# Patient Record
Sex: Female | Born: 1985 | Race: Black or African American | Hispanic: No | Marital: Single | Smoking: Never smoker
Health system: Southern US, Community
[De-identification: ages and names within clinical notes are randomized; demographics above are authoritative.]

---

## 2006-08-30 ENCOUNTER — Emergency Department: Payer: Self-pay | Admitting: General Practice

## 2008-09-16 ENCOUNTER — Emergency Department (HOSPITAL_COMMUNITY): Admission: EM | Admit: 2008-09-16 | Discharge: 2008-09-16 | Payer: Self-pay | Admitting: Family Medicine

## 2010-02-20 IMAGING — CR DG CERVICAL SPINE COMPLETE 4+V
5 series · 5 of 5 positions shown · non-contrast
Comparison: None

CLINICAL DATA: patient thrown down stairs.  Right-sided neck pain.

CERVICAL SPINE - COMPLETE 4+ VIEW

[view not recorded (1 of 5)]
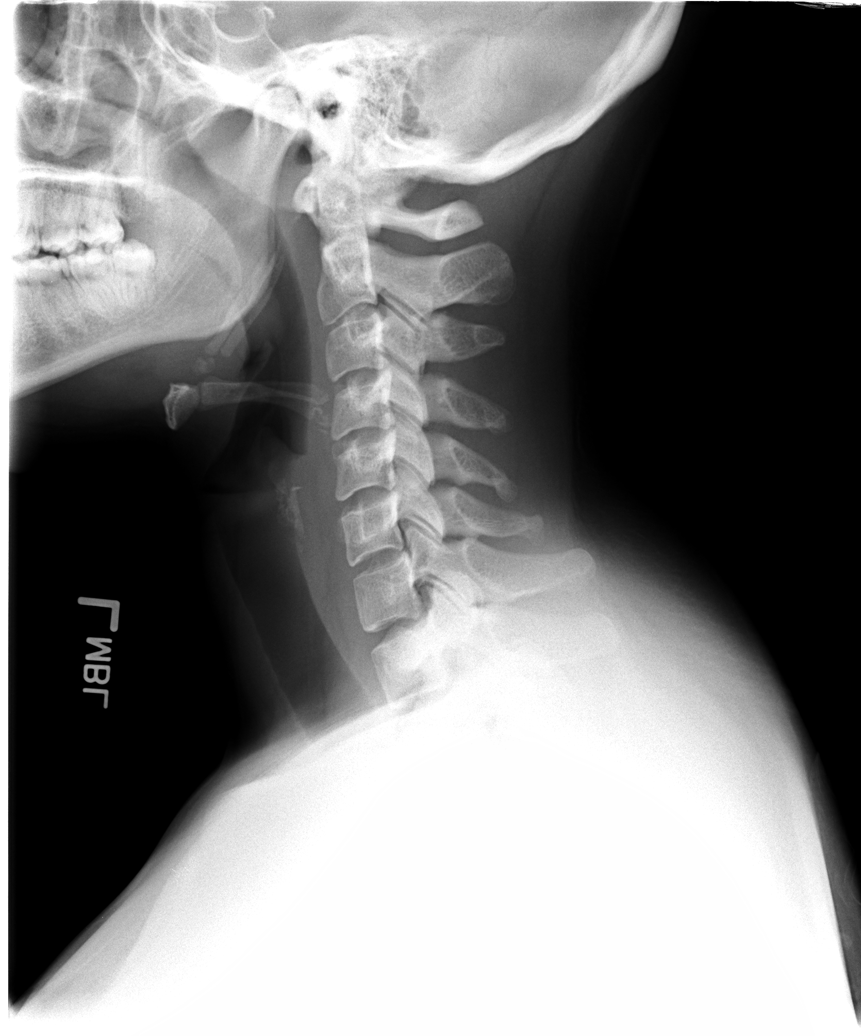

[view not recorded (2 of 5)]
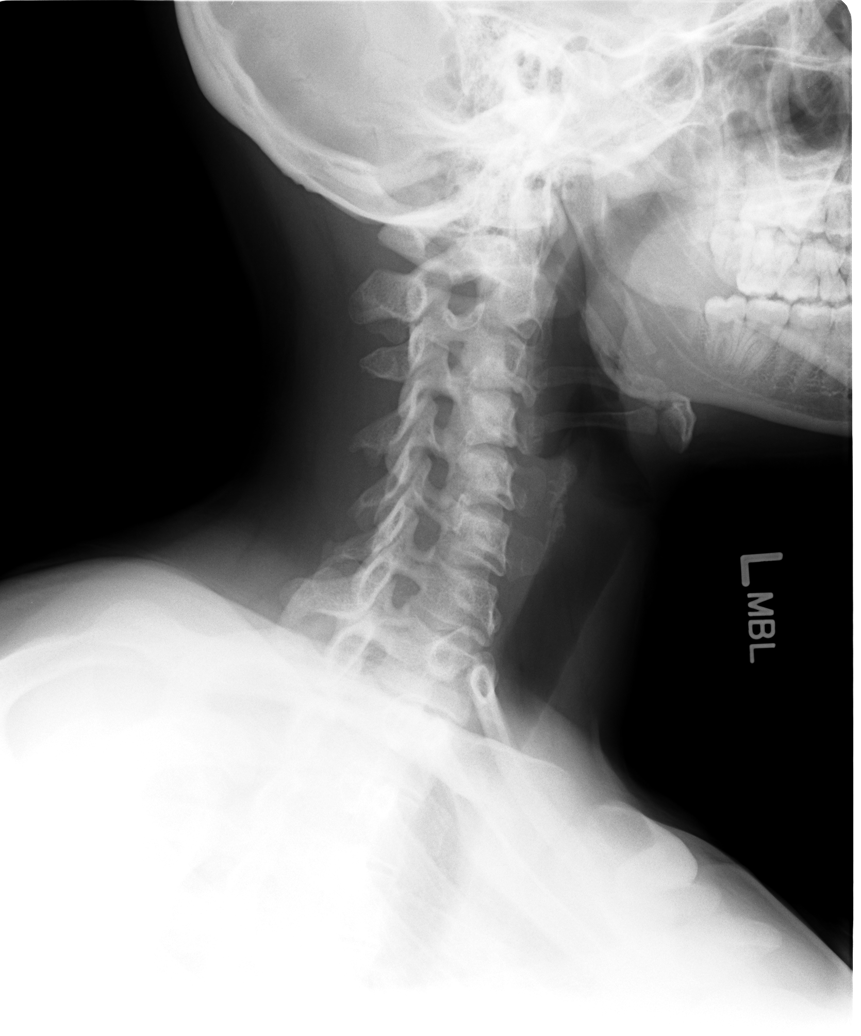

[view not recorded (3 of 5)]
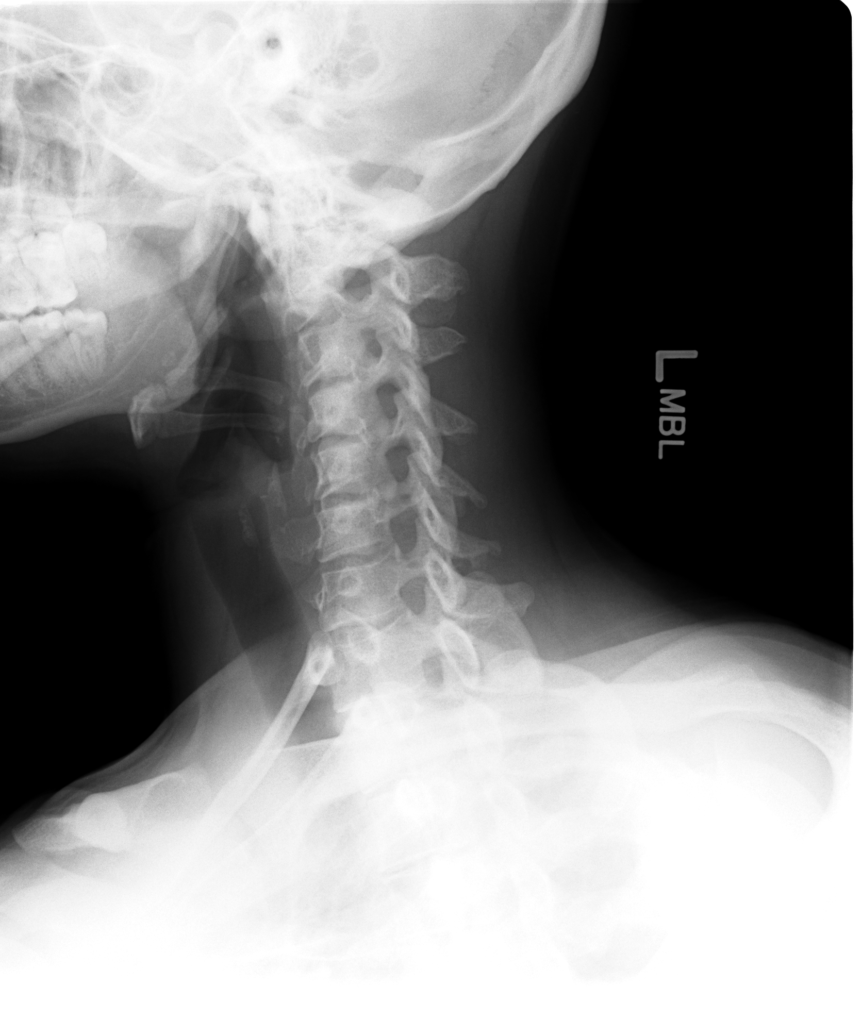

[view not recorded (4 of 5)]
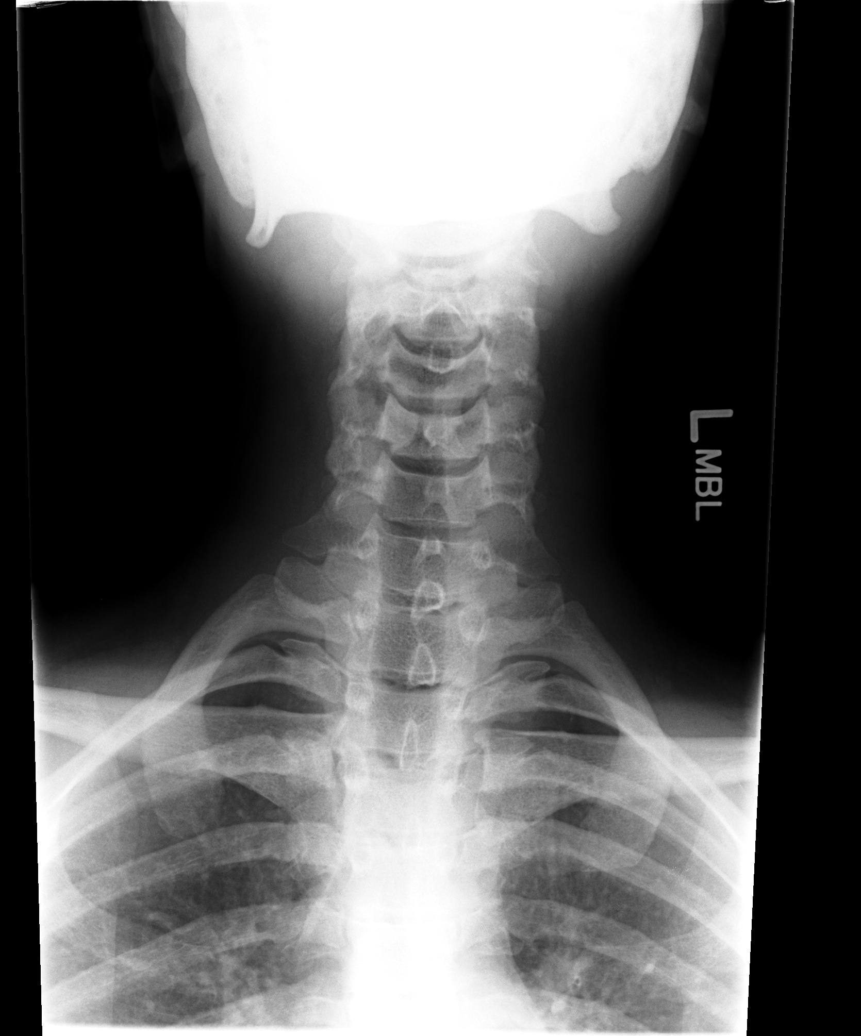

[view not recorded (5 of 5)]
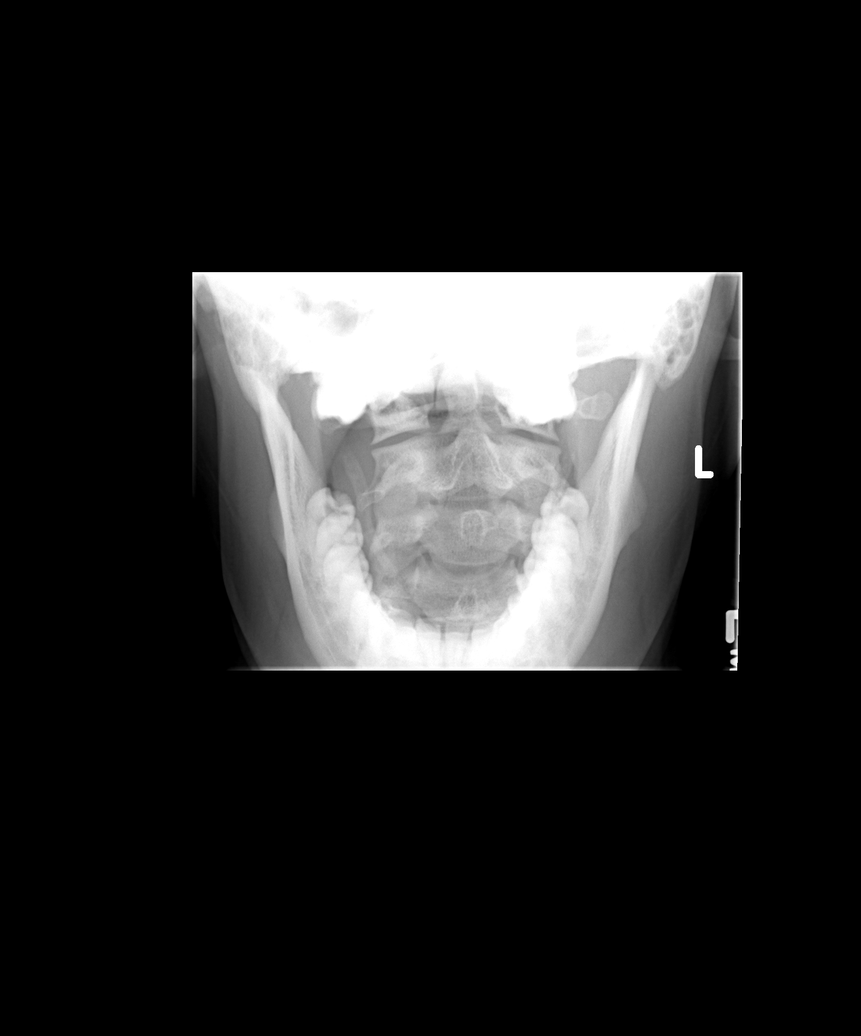

[5 of 5 positions shown; findings below may reference images not displayed]

FINDINGS: There is loss of normal cervical lordosis.  No evidence
of fracture or subluxation.  Prevertebral soft tissues are normal.
Cervicothoracic junction normal.
IMPRESSION: Cervical straightening.  No acute bony abnormality.

## 2010-10-25 LAB — POCT URINALYSIS DIP (DEVICE)
Ketones, ur: 15 mg/dL — AB
Protein, ur: 30 mg/dL — AB
Specific Gravity, Urine: 1.025 (ref 1.005–1.030)
pH: 6 (ref 5.0–8.0)

## 2010-10-25 LAB — POCT PREGNANCY, URINE: Preg Test, Ur: NEGATIVE

## 2012-12-13 HISTORY — PX: DILATION AND CURETTAGE OF UTERUS: SHX78

## 2012-12-17 ENCOUNTER — Ambulatory Visit: Payer: Self-pay | Admitting: Obstetrics and Gynecology

## 2012-12-17 LAB — URINALYSIS, COMPLETE
Bacteria: NONE SEEN
Bilirubin,UR: NEGATIVE
Ketone: NEGATIVE
Nitrite: NEGATIVE
Protein: NEGATIVE
RBC,UR: <1 /HPF (ref 0–5)
Specific Gravity: 1.014 (ref 1.003–1.030)
Squamous Epithelial: 10
WBC UR: 1 /HPF (ref 0–5)

## 2012-12-18 ENCOUNTER — Ambulatory Visit: Payer: Self-pay | Admitting: Obstetrics and Gynecology

## 2012-12-21 LAB — PATHOLOGY REPORT

## 2013-05-21 LAB — HM HIV SCREENING LAB: HM HIV Screening: NEGATIVE

## 2013-07-01 ENCOUNTER — Encounter: Payer: Self-pay | Admitting: Obstetrics and Gynecology

## 2013-08-06 ENCOUNTER — Inpatient Hospital Stay: Payer: Self-pay

## 2013-08-06 LAB — URINALYSIS, COMPLETE
BLOOD: NEGATIVE
Bacteria: NONE SEEN
Bilirubin,UR: NEGATIVE
Glucose,UR: NEGATIVE mg/dL (ref 0–75)
Leukocyte Esterase: NEGATIVE
Nitrite: NEGATIVE
PH: 7 (ref 4.5–8.0)
PROTEIN: NEGATIVE
SPECIFIC GRAVITY: 1.016 (ref 1.003–1.030)
WBC UR: 3 /HPF (ref 0–5)

## 2013-08-06 LAB — BASIC METABOLIC PANEL
Anion Gap: 5 — ABNORMAL LOW (ref 7–16)
BUN: 5 mg/dL — ABNORMAL LOW (ref 7–18)
CALCIUM: 8.6 mg/dL (ref 8.5–10.1)
CHLORIDE: 108 mmol/L — AB (ref 98–107)
CO2: 25 mmol/L (ref 21–32)
CREATININE: 0.58 mg/dL — AB (ref 0.60–1.30)
GLUCOSE: 76 mg/dL (ref 65–99)
Osmolality: 272 (ref 275–301)
Potassium: 3.5 mmol/L (ref 3.5–5.1)
SODIUM: 138 mmol/L (ref 136–145)

## 2013-08-06 LAB — CBC
HCT: 37.1 % (ref 35.0–47.0)
HGB: 12.5 g/dL (ref 12.0–16.0)
MCH: 31.1 pg (ref 26.0–34.0)
MCHC: 33.6 g/dL (ref 32.0–36.0)
MCV: 93 fL (ref 80–100)
PLATELETS: 149 10*3/uL — AB (ref 150–440)
RBC: 4.01 10*6/uL (ref 3.80–5.20)
RDW: 13.7 % (ref 11.5–14.5)
WBC: 7.6 10*3/uL (ref 3.6–11.0)

## 2013-08-06 LAB — WET PREP, GENITAL

## 2013-08-06 LAB — GC/CHLAMYDIA PROBE AMP

## 2013-08-06 LAB — HCG, QUANTITATIVE, PREGNANCY: BETA HCG, QUANT.: 32930 m[IU]/mL — AB

## 2013-08-07 LAB — CBC WITH DIFFERENTIAL/PLATELET
Basophil #: 0 10*3/uL (ref 0.0–0.1)
Basophil %: 0.5 %
EOS PCT: 1.4 %
Eosinophil #: 0.1 10*3/uL (ref 0.0–0.7)
HCT: 36.4 % (ref 35.0–47.0)
HGB: 12.1 g/dL (ref 12.0–16.0)
LYMPHS ABS: 1.7 10*3/uL (ref 1.0–3.6)
Lymphocyte %: 21.5 %
MCH: 30.8 pg (ref 26.0–34.0)
MCHC: 33.2 g/dL (ref 32.0–36.0)
MCV: 93 fL (ref 80–100)
MONO ABS: 0.7 x10 3/mm (ref 0.2–0.9)
MONOS PCT: 8.4 %
NEUTROS ABS: 5.4 10*3/uL (ref 1.4–6.5)
NEUTROS PCT: 68.2 %
Platelet: 145 10*3/uL — ABNORMAL LOW (ref 150–440)
RBC: 3.93 10*6/uL (ref 3.80–5.20)
RDW: 14 % (ref 11.5–14.5)
WBC: 8 10*3/uL (ref 3.6–11.0)

## 2013-08-08 LAB — CBC WITH DIFFERENTIAL/PLATELET
Basophil #: 0 10*3/uL (ref 0.0–0.1)
Basophil %: 0.3 %
EOS ABS: 0.2 10*3/uL (ref 0.0–0.7)
Eosinophil %: 2.3 %
HCT: 34.8 % — ABNORMAL LOW (ref 35.0–47.0)
HGB: 11.8 g/dL — ABNORMAL LOW (ref 12.0–16.0)
LYMPHS ABS: 2 10*3/uL (ref 1.0–3.6)
Lymphocyte %: 26 %
MCH: 31.5 pg (ref 26.0–34.0)
MCHC: 33.9 g/dL (ref 32.0–36.0)
MCV: 93 fL (ref 80–100)
MONOS PCT: 8.2 %
Monocyte #: 0.6 x10 3/mm (ref 0.2–0.9)
NEUTROS PCT: 63.2 %
Neutrophil #: 4.8 10*3/uL (ref 1.4–6.5)
Platelet: 153 10*3/uL (ref 150–440)
RBC: 3.74 10*6/uL — AB (ref 3.80–5.20)
RDW: 13.8 % (ref 11.5–14.5)
WBC: 7.6 10*3/uL (ref 3.6–11.0)

## 2013-08-09 LAB — CBC WITH DIFFERENTIAL/PLATELET
BASOS ABS: 0 10*3/uL (ref 0.0–0.1)
Basophil %: 0.3 %
Eosinophil #: 0.2 10*3/uL (ref 0.0–0.7)
Eosinophil %: 1.8 %
HCT: 35.3 % (ref 35.0–47.0)
HGB: 11.9 g/dL — AB (ref 12.0–16.0)
LYMPHS ABS: 1.9 10*3/uL (ref 1.0–3.6)
LYMPHS PCT: 21.8 %
MCH: 31.1 pg (ref 26.0–34.0)
MCHC: 33.6 g/dL (ref 32.0–36.0)
MCV: 93 fL (ref 80–100)
MONO ABS: 0.8 x10 3/mm (ref 0.2–0.9)
MONOS PCT: 8.7 %
NEUTROS PCT: 67.4 %
Neutrophil #: 5.9 10*3/uL (ref 1.4–6.5)
PLATELETS: 159 10*3/uL (ref 150–440)
RBC: 3.82 10*6/uL (ref 3.80–5.20)
RDW: 13.7 % (ref 11.5–14.5)
WBC: 8.7 10*3/uL (ref 3.6–11.0)

## 2013-08-09 LAB — URINE CULTURE

## 2013-08-10 LAB — CBC WITH DIFFERENTIAL/PLATELET
Basophil #: 0 10*3/uL (ref 0.0–0.1)
Basophil %: 0.4 %
EOS ABS: 0.2 10*3/uL (ref 0.0–0.7)
Eosinophil %: 2.4 %
HCT: 35.1 % (ref 35.0–47.0)
HGB: 11.7 g/dL — AB (ref 12.0–16.0)
LYMPHS PCT: 26.5 %
Lymphocyte #: 2.3 10*3/uL (ref 1.0–3.6)
MCH: 30.9 pg (ref 26.0–34.0)
MCHC: 33.4 g/dL (ref 32.0–36.0)
MCV: 93 fL (ref 80–100)
MONO ABS: 0.8 x10 3/mm (ref 0.2–0.9)
MONOS PCT: 8.8 %
NEUTROS PCT: 61.9 %
Neutrophil #: 5.3 10*3/uL (ref 1.4–6.5)
PLATELETS: 158 10*3/uL (ref 150–440)
RBC: 3.79 10*6/uL — ABNORMAL LOW (ref 3.80–5.20)
RDW: 13.7 % (ref 11.5–14.5)
WBC: 8.5 10*3/uL (ref 3.6–11.0)

## 2013-08-11 LAB — CBC WITH DIFFERENTIAL/PLATELET
BASOS PCT: 0.3 %
Basophil #: 0 10*3/uL (ref 0.0–0.1)
EOS PCT: 2.1 %
Eosinophil #: 0.2 10*3/uL (ref 0.0–0.7)
HCT: 35.4 % (ref 35.0–47.0)
HGB: 12 g/dL (ref 12.0–16.0)
LYMPHS ABS: 2.2 10*3/uL (ref 1.0–3.6)
Lymphocyte %: 26.1 %
MCH: 31.7 pg (ref 26.0–34.0)
MCHC: 34 g/dL (ref 32.0–36.0)
MCV: 93 fL (ref 80–100)
Monocyte #: 0.8 x10 3/mm (ref 0.2–0.9)
Monocyte %: 9.2 %
NEUTROS ABS: 5.2 10*3/uL (ref 1.4–6.5)
Neutrophil %: 62.3 %
Platelet: 158 10*3/uL (ref 150–440)
RBC: 3.79 10*6/uL — ABNORMAL LOW (ref 3.80–5.20)
RDW: 13.5 % (ref 11.5–14.5)
WBC: 8.4 10*3/uL (ref 3.6–11.0)

## 2013-08-12 ENCOUNTER — Inpatient Hospital Stay: Payer: Self-pay | Admitting: Obstetrics and Gynecology

## 2013-08-12 LAB — CBC WITH DIFFERENTIAL/PLATELET
BASOS ABS: 0 10*3/uL (ref 0.0–0.1)
BASOS PCT: 0.2 %
Eosinophil #: 0.1 10*3/uL (ref 0.0–0.7)
Eosinophil %: 1.4 %
HCT: 38 % (ref 35.0–47.0)
HGB: 12.8 g/dL (ref 12.0–16.0)
LYMPHS ABS: 1.5 10*3/uL (ref 1.0–3.6)
Lymphocyte %: 19.2 %
MCH: 31.2 pg (ref 26.0–34.0)
MCHC: 33.8 g/dL (ref 32.0–36.0)
MCV: 92 fL (ref 80–100)
MONOS PCT: 8.5 %
Monocyte #: 0.7 x10 3/mm (ref 0.2–0.9)
NEUTROS ABS: 5.7 10*3/uL (ref 1.4–6.5)
NEUTROS PCT: 70.7 %
Platelet: 174 10*3/uL (ref 150–440)
RBC: 4.12 10*6/uL (ref 3.80–5.20)
RDW: 13.8 % (ref 11.5–14.5)
WBC: 8 10*3/uL (ref 3.6–11.0)

## 2013-08-13 LAB — DRUG SCREEN, URINE
Amphetamines, Ur Screen: NEGATIVE (ref ?–1000)
Barbiturates, Ur Screen: NEGATIVE (ref ?–200)
Benzodiazepine, Ur Scrn: NEGATIVE (ref ?–200)
Cannabinoid 50 Ng, Ur ~~LOC~~: NEGATIVE (ref ?–50)
Cocaine Metabolite,Ur ~~LOC~~: NEGATIVE (ref ?–300)
MDMA (Ecstasy)Ur Screen: NEGATIVE (ref ?–500)
METHADONE, UR SCREEN: NEGATIVE (ref ?–300)
OPIATE, UR SCREEN: POSITIVE (ref ?–300)
PHENCYCLIDINE (PCP) UR S: NEGATIVE (ref ?–25)
Tricyclic, Ur Screen: NEGATIVE (ref ?–1000)

## 2013-08-13 LAB — HEMOGLOBIN: HGB: 11.2 g/dL — AB (ref 12.0–16.0)

## 2013-08-18 LAB — PATHOLOGY REPORT

## 2014-02-20 ENCOUNTER — Emergency Department: Payer: Self-pay | Admitting: Emergency Medicine

## 2014-02-20 LAB — CBC
HCT: 38.6 % (ref 35.0–47.0)
HGB: 12.9 g/dL (ref 12.0–16.0)
MCH: 30.8 pg (ref 26.0–34.0)
MCHC: 33.4 g/dL (ref 32.0–36.0)
MCV: 92 fL (ref 80–100)
Platelet: 162 10*3/uL (ref 150–440)
RBC: 4.2 10*6/uL (ref 3.80–5.20)
RDW: 15 % — AB (ref 11.5–14.5)
WBC: 7.2 10*3/uL (ref 3.6–11.0)

## 2014-02-20 LAB — HCG, QUANTITATIVE, PREGNANCY: Beta Hcg, Quant.: 118531 m[IU]/mL — ABNORMAL HIGH

## 2014-02-21 LAB — WET PREP, GENITAL

## 2014-02-21 LAB — GC/CHLAMYDIA PROBE AMP

## 2014-02-25 ENCOUNTER — Emergency Department (HOSPITAL_COMMUNITY)
Admission: EM | Admit: 2014-02-25 | Discharge: 2014-02-26 | Disposition: A | Discharge status: Home / Self Care | Payer: BC Managed Care – PPO | Attending: Emergency Medicine | Admitting: Emergency Medicine

## 2014-02-25 ENCOUNTER — Emergency Department (HOSPITAL_COMMUNITY): Payer: BC Managed Care – PPO

## 2014-02-25 ENCOUNTER — Encounter (HOSPITAL_COMMUNITY): Payer: Self-pay | Admitting: Emergency Medicine

## 2014-02-25 DIAGNOSIS — O200 Threatened abortion: Secondary | ICD-10-CM | POA: Insufficient documentation

## 2014-02-25 DIAGNOSIS — Z79899 Other long term (current) drug therapy: Secondary | ICD-10-CM | POA: Diagnosis not present

## 2014-02-25 DIAGNOSIS — N938 Other specified abnormal uterine and vaginal bleeding: Secondary | ICD-10-CM | POA: Insufficient documentation

## 2014-02-25 DIAGNOSIS — O368990 Maternal care for other specified fetal problems, unspecified trimester, not applicable or unspecified: Secondary | ICD-10-CM | POA: Insufficient documentation

## 2014-02-25 DIAGNOSIS — O418X10 Other specified disorders of amniotic fluid and membranes, first trimester, not applicable or unspecified: Secondary | ICD-10-CM

## 2014-02-25 DIAGNOSIS — O43899 Other placental disorders, unspecified trimester: Secondary | ICD-10-CM

## 2014-02-25 DIAGNOSIS — N949 Unspecified condition associated with female genital organs and menstrual cycle: Secondary | ICD-10-CM | POA: Insufficient documentation

## 2014-02-25 DIAGNOSIS — O468X1 Other antepartum hemorrhage, first trimester: Secondary | ICD-10-CM

## 2014-02-25 LAB — BASIC METABOLIC PANEL
Anion gap: 12 (ref 5–15)
BUN: 8 mg/dL (ref 6–23)
CHLORIDE: 98 meq/L (ref 96–112)
CO2: 24 mEq/L (ref 19–32)
Calcium: 9.7 mg/dL (ref 8.4–10.5)
Creatinine, Ser: 0.58 mg/dL (ref 0.50–1.10)
Glucose, Bld: 95 mg/dL (ref 70–99)
POTASSIUM: 4.3 meq/L (ref 3.7–5.3)
SODIUM: 134 meq/L — AB (ref 137–147)

## 2014-02-25 LAB — CBC WITH DIFFERENTIAL/PLATELET
BASOS PCT: 0 % (ref 0–1)
Basophils Absolute: 0 10*3/uL (ref 0.0–0.1)
EOS ABS: 0.1 10*3/uL (ref 0.0–0.7)
Eosinophils Relative: 1 % (ref 0–5)
HCT: 36.5 % (ref 36.0–46.0)
HEMOGLOBIN: 12.4 g/dL (ref 12.0–15.0)
LYMPHS ABS: 1.8 10*3/uL (ref 0.7–4.0)
Lymphocytes Relative: 24 % (ref 12–46)
MCH: 29.9 pg (ref 26.0–34.0)
MCHC: 34 g/dL (ref 30.0–36.0)
MCV: 88 fL (ref 78.0–100.0)
Monocytes Absolute: 0.5 10*3/uL (ref 0.1–1.0)
Monocytes Relative: 7 % (ref 3–12)
NEUTROS ABS: 5 10*3/uL (ref 1.7–7.7)
NEUTROS PCT: 68 % (ref 43–77)
PLATELETS: 178 10*3/uL (ref 150–400)
RBC: 4.15 MIL/uL (ref 3.87–5.11)
RDW: 14.4 % (ref 11.5–15.5)
WBC: 7.4 10*3/uL (ref 4.0–10.5)

## 2014-02-25 LAB — HCG, QUANTITATIVE, PREGNANCY: HCG, BETA CHAIN, QUANT, S: 127937 m[IU]/mL — AB (ref <5)

## 2014-02-25 NOTE — ED Notes (Addendum)
Pt. Came in POV with complaint of possible miscarriage , stated that she had large amount vaginal bleeding started at 0815 pm this [place]  [place] , also claimed of  Abdominal cramping at 6/10. Pt. Claimed of 8 weeks pregnancy. Pt. Had 2 miscarriage , latest was June of 2014.

## 2014-02-25 NOTE — ED Provider Notes (Signed)
CSN: [id number]     Arrival date & time 02/25/14  2046 History   First MD Initiated Contact with Patient 02/25/14 2139     Chief Complaint  Patient presents with  . Vaginal Bleeding  . Threatened Miscarriage    HPI Pt is [redacted] weeks pregnant, G3P0 (2 prior miscarriages).  History of recent vaginal bleeding.  Seen at [facility] last weekend.  Had an ultrasound and diagnosed with threatened ab.  Pt is A-.  Did receive rhogam last week.  Pt states she started having bleeding and abdominal cramping.   No past medical history on file. No past surgical history on file. History reviewed. No pertinent family history. History  Substance Use Topics  . Smoking status: Never Smoker   . Smokeless tobacco: Not on file  . Alcohol Use: No   OB History   Grav Para Term Preterm Abortions TAB SAB Ect Mult Living                 Review of Systems  All other systems reviewed and are negative.     Allergies  Review of patient's allergies indicates no known allergies.  Home Medications   Prior to Admission medications   Medication Sig Start Date End Date Taking? Authorizing Provider  Prenatal Vit-Fe Fumarate-FA (PRENATAL MULTIVITAMIN) TABS tablet Take 1 tablet by mouth daily at 12 noon.   Yes Historical Provider, MD   Pulse 73  Temp(Src) 98.3 F (36.8 C) (Oral)  Resp 16  Ht 5\' 5"  (1.651 m)  Wt 201 lb (91.173 kg)  BMI 33.45 kg/m2  SpO2 100% Physical Exam  Nursing note and vitals reviewed. Constitutional: She appears well-developed and well-nourished. No distress.  HENT:  Head: Normocephalic and atraumatic.  Right Ear: External ear normal.  Left Ear: External ear normal.  Eyes: Conjunctivae are normal. Right eye exhibits no discharge. Left eye exhibits no discharge. No scleral icterus.  Neck: Neck supple. No tracheal deviation present.  Cardiovascular: Normal rate, regular rhythm and intact distal pulses.   Pulmonary/Chest: Effort normal and breath sounds normal. No stridor. No  respiratory distress. She has no wheezes. She has no rales.  Abdominal: Soft. Bowel sounds are normal. She exhibits no distension. There is no tenderness. There is no rebound and no guarding.  Genitourinary: Uterus is enlarged. Uterus is not tender. Cervix exhibits no motion tenderness and no friability. Right adnexum displays no mass. Left adnexum displays no mass and no tenderness. There is bleeding around the vagina. No signs of injury around the vagina.  Blood in vaginal vault  Musculoskeletal: She exhibits no edema and no tenderness.  Neurological: She is alert. She has normal strength. No cranial nerve deficit (no facial droop, extraocular movements intact, no slurred speech) or sensory deficit. She exhibits normal muscle tone. She displays no seizure activity. Coordination normal.  Skin: Skin is warm and dry. No rash noted.  Psychiatric: She has a normal mood and affect.    ED Course  Procedures (including critical care time) Labs Review Labs Reviewed  BASIC METABOLIC PANEL - Abnormal; Notable for the following:    Sodium 134 (*)    All other components within normal limits  HCG, QUANTITATIVE, PREGNANCY - Abnormal; Notable for the following:    hCG, Beta Francene Finders [id number] (*)    All other components within normal limits  CBC WITH DIFFERENTIAL    Imaging Review US Ob Comp Less 14 Wks  02/26/2014   CLINICAL DATA:  Vaginal bleeding.  EXAM: OBSTETRIC <14  WK US AND TRANSVAGINAL OB US  TECHNIQUE: Both transabdominal and transvaginal ultrasound examinations were performed for complete evaluation of the gestation as well as the maternal uterus, adnexal regions, and pelvic cul-de-sac. Transvaginal technique was performed to assess early pregnancy.  COMPARISON:  None.  FINDINGS: Intrauterine gestational sac: Visualized/normal in shape.  Yolk sac:  Visualized  Embryo:  Visualized  Cardiac Activity: Visualized  Heart Rate:  173 bpm  MSD:    mm    w     d  CRL:   21  mm   8 w 5 d                   US EDC: 10/02/2014  Maternal uterus/adnexae: Small subchorionic hemorrhage. Ovaries symmetric in size and echotexture. No adnexal masses. No free fluid.  IMPRESSION: Eight week 5 day intrauterine pregnancy. Fetal heart rate 173 beats per min. Small subchorionic hemorrhage.   Electronically Signed   By: Charlett NoseKevin  Dover M.D.   On: 02/26/2014 00:01   [place] Ob Transvaginal  02/26/2014   CLINICAL DATA:  Vaginal bleeding.  EXAM: OBSTETRIC <14 WK US AND TRANSVAGINAL OB US  TECHNIQUE: Both transabdominal and transvaginal ultrasound examinations were performed for complete evaluation of the gestation as well as the maternal uterus, adnexal regions, and pelvic cul-de-sac. Transvaginal technique was performed to assess early pregnancy.  COMPARISON:  None.  FINDINGS: Intrauterine gestational sac: Visualized/normal in shape.  Yolk sac:  Visualized  Embryo:  Visualized  Cardiac Activity: Visualized  Heart Rate:  173 bpm  MSD:    mm    w     d  CRL:   21  mm   8 w 5 d                  US EDC: 10/02/2014  Maternal uterus/adnexae: Small subchorionic hemorrhage. Ovaries symmetric in size and echotexture. No adnexal masses. No free fluid.  IMPRESSION: Eight week 5 day intrauterine pregnancy. Fetal heart rate 173 beats per min. Small subchorionic hemorrhage.   Electronically Signed   By: Charlett NoseKevin  Dover M.D.   On: 02/26/2014 00:01      MDM   Final diagnoses:  Subchorionic hemorrhage, first trimester  Threatened miscarriage in early pregnancy    Pt is having persistent threatened miscarriage.  US continues to show viable fetus.  Pt is A- but did receive rhogham within this week.  No need for additional dose.  Pt stable for discharge.  Follow up with her OB GYN    Linwood DibblesJon Dietra Stokely, MD 02/26/14 [phone]

## 2014-02-26 NOTE — Discharge Instructions (Signed)

## 2014-11-04 NOTE — Op Note (Signed)
PATIENT NAME:  Shirley Weber, Shirley Weber MR#:  [id number] DATE OF BIRTH:  22-Nov-1985  DATE OF PROCEDURE:  12/18/2012  PREOPERATIVE DIAGNOSIS: Incomplete abortion.   POSTOPERATIVE DIAGNOSIS:  Incomplete abortion.  PROCEDURE:  Suction dilation and curettage.  SURGEON: Ricky Weber. Logan BoresEvans, MD  ANESTHESIA: LMA.   FINDINGS: Easily dilatable cervix, return of products consistent with products of conception. Good uterine cry at the end of the case.   ESTIMATED BLOOD LOSS: Minimal.   COMPLICATIONS: None.   DRAINS: In and out catheter with a red rubber catheter at the end of the case with approximately 100 mL of urine.   SPECIMEN: Products of conception.   COMPLICATIONS: None.   ANTIBIOTICS:  A gram of Ancef given IV preoperatively.   PROCEDURE IN DETAIL: The patient was consented. Preoperative antibiotics were given. She was taken to the operating room and placed in the supine position where anesthesia was initiated. She was then placed in the dorsal lithotomy position using candy-cane stirrups, prepped and draped in the usual sterile fashion. The cervix was visualized and grasped with a single-tooth tenaculum, easily dilated to permit passage of a #8 rigid curved suction curette, which was passed with immediate return of products of conception. This was done approximately 3 more times then gentle sharp curettage was performed showing good uterine cry throughout including the cornual region.   Suction curette was passed 1 additional time and there was minimal return of bubbly blood and notable firming of the uterus was seen.   At this point, the procedure was felt to have achieved maximum efficacy. Instruments were removed. The cervix was made hemostatic with silver nitrate and the bladder was drained.   The patient tolerated the procedure well. She was returned to the supine position and left in the care of anesthesia.   I anticipate a routine postoperative course.  ____________________________ Reatha Harpsicky  Weber. Logan BoresEvans, MD rle:sb D: 12/18/2012 10:34:48 ET T: 12/18/2012 10:41:37 ET JOB#: [id number]  cc: Ricky Weber. Logan BoresEvans, MD, <Dictator> Augustina MoodICK Weber Stella Encarnacion MD ELECTRONICALLY SIGNED 12/19/2012 11:09

## 2014-11-22 NOTE — H&P (Signed)
L&D Evaluation:  History:  HPI 29 y/o G2P0010 @ 17/[red[facility] ED this am with reports of leaking clear fluid ("gush down my legs") after voiding early this am. Denies vaginal bleeding,cramping, abdominal tenderness, backache or UTI sx. No fetal movement NL @ 17wks). Care @ [facility]. US per ER MD FHT +133 no amniotic fluid visualized, minimal fetal movement. [facility] NL scan 07/01/13. A negative blood type.   Presents with leaking fluid   Patient's Medical History No Chronic Illness  HX GC CMZ Trich with PID prior to this pregnancy. Negative  GC CMZ  05/28/13   Patient's Surgical History Incomplete SAB 1st trimester with DC 12/2012   Medications Pre Natal Vitamins   Allergies NKDA   Social History none   Family History Non-Contributory   ROS:  ROS All systems were reviewed.  HEENT, CNS, GI, GU, Respiratory, CV, Renal and Musculoskeletal systems were found to be normal.   Exam:  Vital Signs stable  AF   Urine Protein to lab   General no apparent distress   Mental Status clear   Chest clear   Heart normal sinus rhythm   Abdomen gravid, non-tender   Estimated Fetal Weight Average for gestational age   Fetal Position breech   Fundal Height U-2   Back no CVAT   Edema no edema   Reflexes 1+   Clonus negative   Pelvic no external lesions, Sterile speculum exam (cx appeared closed-no vaginal bleeding) no digital exam   Mebranes Ruptured, clear gush @ 0530 - 08/06/13   Description clear   FHT + US 130's   Ucx absent   Skin dry   Lymph no lymphadenopathy   Impression:  Impression PPROM, PPROM @ 17/4wks   Plan:  Plan see below   Comments Discussed Ultrasound +FHT, no amniotic fluid visualized, + nitrazine + fern at 17/4wks. Pt and family advised baby not viable until 23weeks (severely preterm). Support offered, multiple questions answered. Will observe on LD, begin antibiotics and plan to transfer to [facility] for next few  days. Will repeat US 08/08/13. Explained if maternal fever/lab changes, cramping, bleeding begin, will proceed to delivery. Awaiting UA CS, GC CMZ culture per ER. Wet prep negative. Dr Feliberto GottronSchermerhorn consulted, orders received.   Electronic Signatures: Shirley Weber, Shirley Weber (CNM)  (Signed 23-Jan-15 12:23)  Authored: L&D Evaluation   Last Updated: 23-Jan-15 12:23 by Shirley Weber, Kaprice Kage Weber (CNM)

## 2019-02-12 ENCOUNTER — Ambulatory Visit (LOCAL_COMMUNITY_HEALTH_CENTER): Payer: Self-pay

## 2019-02-12 ENCOUNTER — Other Ambulatory Visit: Payer: Self-pay

## 2019-02-12 DIAGNOSIS — Z111 Encounter for screening for respiratory tuberculosis: Secondary | ICD-10-CM

## 2019-02-15 ENCOUNTER — Other Ambulatory Visit: Payer: Self-pay

## 2019-02-15 ENCOUNTER — Ambulatory Visit (LOCAL_COMMUNITY_HEALTH_CENTER): Payer: Medicaid Other

## 2019-02-15 DIAGNOSIS — Z111 Encounter for screening for respiratory tuberculosis: Secondary | ICD-10-CM

## 2019-02-15 LAB — TB SKIN TEST
Induration: 0 mm
TB Skin Test: NEGATIVE

## 2019-10-25 ENCOUNTER — Other Ambulatory Visit: Payer: Self-pay

## 2019-10-25 ENCOUNTER — Ambulatory Visit
Admission: EM | Admit: 2019-10-25 | Discharge: 2019-10-25 | Disposition: A | Discharge status: Home / Self Care | Payer: Medicaid Other | Attending: Emergency Medicine | Admitting: Emergency Medicine

## 2019-10-25 DIAGNOSIS — Z202 Contact with and (suspected) exposure to infections with a predominantly sexual mode of transmission: Secondary | ICD-10-CM | POA: Diagnosis present

## 2019-10-25 DIAGNOSIS — N898 Other specified noninflammatory disorders of vagina: Secondary | ICD-10-CM | POA: Insufficient documentation

## 2019-10-25 LAB — POCT URINALYSIS DIP (MANUAL ENTRY)
Bilirubin, UA: NEGATIVE
Glucose, UA: NEGATIVE mg/dL
Ketones, POC UA: NEGATIVE mg/dL
Nitrite, UA: NEGATIVE
Protein Ur, POC: NEGATIVE mg/dL
Spec Grav, UA: 1.015 (ref 1.010–1.025)
Urobilinogen, UA: 0.2 E.U./dL
pH, UA: 6.5 (ref 5.0–8.0)

## 2019-10-25 MED ORDER — CEFTRIAXONE SODIUM 500 MG IJ SOLR
500.0000 mg | Freq: Once | INTRAMUSCULAR | Status: AC
  Administered 2019-10-25: 09:00:00 500 mg via INTRAMUSCULAR

## 2019-10-25 MED ORDER — DOXYCYCLINE HYCLATE 100 MG PO CAPS: 100.0000 mg | ORAL_CAPSULE | Freq: Two times a day (BID) | ORAL | 0 refills | Status: AC

## 2019-10-25 MED ORDER — METRONIDAZOLE 500 MG PO TABS: 500.0000 mg | ORAL_TABLET | Freq: Two times a day (BID) | ORAL | 0 refills | Status: AC

## 2019-10-25 NOTE — ED Provider Notes (Signed)
Renaldo Fiddler    CSN: [id number] Arrival date & time: 10/25/19  0847      History   Chief Complaint Chief Complaint  Patient presents with  . Urinary Tract Infection    HPI Shirley Weber is a 34 y.o. female. Patient presents with 3 day history of white-green vaginal discharge and vaginal irritation.  The irritation is worse when she urinates.  She states she recently had sex without using a condom and would like to be checked for STDs.  She denies fever, chills, rash, lesions, abdominal pain, dysuria, back pain, pelvic pain, or other symptoms.  No treatment attempted at home.     The history is provided by the patient.    History reviewed. No pertinent past medical history.  There are no problems to display for this patient.   Past Surgical History:  Procedure Laterality Date  . DILATION AND CURETTAGE OF UTERUS  12/2012   blighted ovum    OB History   No obstetric history on file.      Home Medications    Prior to Admission medications   Medication Sig Start Date End Date Taking? Authorizing Provider  acetaminophen (TYLENOL) 325 MG tablet Take by mouth. 09/28/17  Yes [provider]  Docusate Sodium (DSS) 100 MG CAPS Take by mouth. 09/28/17  Yes [provider]  ibuprofen (ADVIL) 600 MG tablet Take by mouth. 09/28/17  Yes [provider]  doxycycline (VIBRAMYCIN) 100 MG capsule Take 1 capsule (100 mg total) by mouth 2 (two) times daily for 7 days. 10/25/19 11/01/19  Mickie Bail, NP  metroNIDAZOLE (FLAGYL) 500 MG tablet Take 1 tablet (500 mg total) by mouth 2 (two) times daily. 10/25/19   Mickie Bail, NP  Prenatal 28-0.8 MG TABS Take by mouth.    [provider]  Prenatal Vit-Fe Fumarate-FA (PNV PRENATAL PLUS MULTIVITAMIN) 27-1 MG TABS Take by mouth.    [provider]  Prenatal Vit-Fe Fumarate-FA (PRENATAL MULTIVITAMIN) TABS tablet Take 1 tablet by mouth daily at 12 noon.    [provider]  zolpidem  (AMBIEN) 5 MG tablet Take by mouth.    [provider]    Family History Family History  Problem Relation Age of Onset  . Clotting disorder Mother        blood clots  . Arrhythmia Father   . Heart disease Maternal Grandfather     Social History Social History   Tobacco Use  . Smoking status: Never Smoker  . Smokeless tobacco: Never Used  Substance Use Topics  . Alcohol use: Yes  . Drug use: No     Allergies   Latex   Review of Systems Review of Systems  Constitutional: Negative for chills and fever.  HENT: Negative for ear pain and sore throat.   Eyes: Negative for pain and visual disturbance.  Respiratory: Negative for cough and shortness of breath.   Cardiovascular: Negative for chest pain and palpitations.  Gastrointestinal: Negative for abdominal pain and vomiting.  Genitourinary: Positive for vaginal discharge. Negative for dysuria, flank pain, hematuria and pelvic pain.  Musculoskeletal: Negative for arthralgias and back pain.  Skin: Negative for color change and rash.  Neurological: Negative for seizures and syncope.  All other systems reviewed and are negative.    Physical Exam Triage Vital Signs ED Triage Vitals  Enc Vitals Group     BP      Pulse      Resp      Temp  Temp src      SpO2      Weight      Height      Head Circumference      Peak Flow      Pain Score      Pain Loc      Pain Edu?      Excl. in GC?    No data found.  Updated Vital Signs BP 132/83 (BP Location: Left Arm)   Pulse 60   Temp 98.1 F (36.7 C) (Oral)   Resp 16   Wt 185 lb (83.9 kg)   SpO2 97%   BMI 29.63 kg/m   Visual Acuity Right Eye Distance:   Left Eye Distance:   Bilateral Distance:    Right Eye Near:   Left Eye Near:    Bilateral Near:     Physical Exam Vitals and nursing note reviewed.  Constitutional:      General: She is not in acute distress.    Appearance: She is well-developed.  HENT:     Head: Normocephalic and  atraumatic.     Mouth/Throat:     Mouth: Mucous membranes are moist.     Pharynx: Oropharynx is clear.  Eyes:     Conjunctiva/sclera: Conjunctivae normal.  Cardiovascular:     Rate and Rhythm: Normal rate and regular rhythm.     Heart sounds: No murmur.  Pulmonary:     Effort: Pulmonary effort is normal. No respiratory distress.     Breath sounds: Normal breath sounds.  Abdominal:     General: Bowel sounds are normal.     Palpations: Abdomen is soft.     Tenderness: There is no abdominal tenderness. There is no right CVA tenderness, left CVA tenderness, guarding or rebound.  Musculoskeletal:     Cervical back: Neck supple.  Skin:    General: Skin is warm and dry.     Findings: No rash.  Neurological:     General: No focal deficit present.     Mental Status: She is alert and oriented to person, place, and time.  Psychiatric:        Mood and Affect: Mood normal.        Behavior: Behavior normal.      UC Treatments / Results  Labs (all labs ordered are listed, but only abnormal results are displayed) Labs Reviewed  POCT URINALYSIS DIP (MANUAL ENTRY) - Abnormal; Notable for the following components:      Result Value   Blood, UA trace-intact (*)    Leukocytes, UA Trace (*)    All other components within normal limits  URINE CULTURE  CERVICOVAGINAL ANCILLARY ONLY    EKG   Radiology No results found.  Procedures Procedures (including critical care time)  Medications Ordered in UC Medications  cefTRIAXone (ROCEPHIN) injection 500 mg (500 mg Intramuscular Given 10/25/19 0913)    Initial Impression / Assessment and Plan / UC Course  I have reviewed the triage vital signs and the nursing notes.  Pertinent labs & imaging results that were available during my care of the patient were reviewed by me and considered in my medical decision making (see chart for details).   Vaginal discharge, possible exposure to STD.  Urine culture pending.  Patient obtained vaginal self  swab.  Treating with Rocephin, doxycycline, metronidazole.  Instructed patient to abstain from sexual activity for 7 days.  Discussed that we will call her if her STD tests are positive and that her partner may require treatment  at that time.  Patient agrees to plan of care.   Final Clinical Impressions(s) / UC Diagnoses   Final diagnoses:  Possible exposure to STD  Vaginal discharge     Discharge Instructions     You were treated with an antibiotic today called Rocephin.  Additionally, you are prescribed metronidazole twice a day for 7 days and doxycycline twice a day for 7 days.    Do not have sex for 7 days.  Your STD tests are pending.  If your test results are positive, we will call you.  You may need additional treatment and your partner(s) may also need treatment.           ED Prescriptions    Medication Sig Dispense Auth. Provider   doxycycline (VIBRAMYCIN) 100 MG capsule Take 1 capsule (100 mg total) by mouth 2 (two) times daily for 7 days. 14 capsule Sharion Balloon, NP   metroNIDAZOLE (FLAGYL) 500 MG tablet Take 1 tablet (500 mg total) by mouth 2 (two) times daily. 14 tablet Sharion Balloon, NP     PDMP not reviewed this encounter.   Sharion Balloon, NP 10/25/19 [phone]

## 2019-10-25 NOTE — ED Triage Notes (Signed)
Pt is here with vaginal discharge and itching when urinating this started 3 days ago. Pt has not taken anything to relieve discomfort.

## 2019-10-25 NOTE — Discharge Instructions (Addendum)
You were treated with an antibiotic today called Rocephin.  Additionally, you are prescribed metronidazole twice a day for 7 days and doxycycline twice a day for 7 days.    Do not have sex for 7 days.  Your STD tests are pending.  If your test results are positive, we will call you.  You may need additional treatment and your partner(s) may also need treatment.      

## 2019-10-26 ENCOUNTER — Telehealth (HOSPITAL_COMMUNITY): Payer: Self-pay

## 2019-10-26 LAB — CERVICOVAGINAL ANCILLARY ONLY
Bacterial Vaginitis (gardnerella): NEGATIVE
Candida Glabrata: NEGATIVE
Candida Vaginitis: POSITIVE — AB
Chlamydia: NEGATIVE
Comment: NEGATIVE
Comment: NEGATIVE
Comment: NEGATIVE
Comment: NEGATIVE
Comment: NEGATIVE
Comment: NORMAL
Neisseria Gonorrhea: NEGATIVE
Trichomonas: NEGATIVE

## 2019-10-26 LAB — URINE CULTURE: Culture: NO GROWTH

## 2019-10-26 MED ORDER — FLUCONAZOLE 150 MG PO TABS: 150.0000 mg | ORAL_TABLET | Freq: Every day | ORAL | 0 refills | Status: AC

## 2019-10-28 ENCOUNTER — Encounter: Payer: Self-pay | Admitting: Physician Assistant

## 2019-10-28 ENCOUNTER — Other Ambulatory Visit: Payer: Self-pay

## 2019-10-28 ENCOUNTER — Ambulatory Visit (LOCAL_COMMUNITY_HEALTH_CENTER): Payer: Medicaid Other | Admitting: Physician Assistant

## 2019-10-28 VITALS — BP 114/74 | Ht 66.25 in | Wt 189.6 lb

## 2019-10-28 DIAGNOSIS — Z309 Encounter for contraceptive management, unspecified: Secondary | ICD-10-CM

## 2019-10-28 DIAGNOSIS — Z3046 Encounter for surveillance of implantable subdermal contraceptive: Secondary | ICD-10-CM

## 2019-10-28 MED ORDER — NORGESTIM-ETH ESTRAD TRIPHASIC 0.18/0.215/0.25 MG-25 MCG PO TABS: 1.0000 | ORAL_TABLET | Freq: Every day | ORAL | 3 refills | Status: AC

## 2019-10-28 NOTE — Progress Notes (Signed)
In for Nexplanon removal due to face breaking out and weight gain; desires O.C.'s; agrees to HIV/RPR testing but declines pap today Sharlette Dense, RN

## 2019-10-28 NOTE — Progress Notes (Signed)
   [facility]  Subjective:  Shirley Weber is a 34 y.o. being seen today for   Chief Complaint  Patient presents with  . Contraception    34 yo woman here for Nexplanon removal. Was placed by [facility] 07/2019, and she states she has had weight gain, facial acne that she attributes to the Nexplanon. Has used OCPs in the past without problems, lifelong nonsmoker. Wants to restart OCPs. Last sex 08/2019. Was sched for Nexplanon removal at [facility] tomorrow, but came here due to location/conveniece.    Does the patient have a current or past history of drug use? No   No components found for: HCV]   Health Maintenance Due  Topic Date Due  . PAP SMEAR-Modifier  Never done    Review of Systems  Constitutional:       See HPI.  HENT: Negative.   Eyes: Negative.   Respiratory: Negative.   Cardiovascular: Negative.   Gastrointestinal: Negative.   Genitourinary: Negative.   Musculoskeletal: Negative.   Skin:       See HPI.  Neurological: Negative.   Endo/Heme/Allergies: Negative.   Psychiatric/Behavioral: Negative.     The following portions of the patient's history were reviewed and updated as appropriate: allergies, current medications, past family history, past medical history, past social history, past surgical history and problem list. Problem list updated.   See flowsheet for other program required questions.  Objective:   Vitals:   10/28/19 0826  BP: 114/74  Weight: 189 lb 9.6 oz (86 kg)  Height: 5' 6.25" (1.683 m)    Physical Exam Constitutional:      Appearance: Normal appearance.  Pulmonary:     Effort: Pulmonary effort is normal.  Skin:    General: Skin is warm and dry.     Comments: Palpable nontender 4cm rod below skin on R medial UE with small scar at distal end.  Neurological:     Mental Status: She is alert and oriented to person, place, and time.  Psychiatric:        Behavior: Behavior  normal.        Thought Content: Thought content normal.        Judgment: Judgment normal.    Nexplanon Removal Patient identified, informed consent performed, consent signed.   Appropriate time out taken. Nexplanon site identified in right arm.  Area prepped in usual sterile fashon. 3 ml of 1% lidocaine with Epinephrine was used to anesthetize the area at the distal end of the implant and along implant site. A small stab incision was made right beside the implant on the distal portion.  The Nexplanon rod was grasped using hemostats and removed without difficulty.  There was minimal blood loss. There were no complications.  Steri-strips were applied over the small incision.  A pressure bandage was applied to reduce any bruising.  The patient tolerated the procedure well and was given post procedure instructions.    Assessment and Plan:  Shirley Weber is a 34 y.o. female presenting to the [facility] Department for a Women's Health problem visit  1. Encounter for contraceptive management, unspecified type Nexplanon removed at pt request. eRx COCPs to start today. Continue daily multivitamin with folic acid. - HIV [facility] - Syphilis Serology, [facility]  2. Nexplanon removal   Return in about 3 months (around 01/27/2020) for F/u OCPs/Pap.  No future appointments.  Landry Dyke, PA-C

## 2021-02-07 ENCOUNTER — Other Ambulatory Visit: Payer: Self-pay

## 2021-02-07 MED ORDER — CARESTART COVID-19 HOME TEST VI KIT
PACK | 0 refills | Status: AC
  Filled 2021-02-07: qty 2, 4d supply, fill #0

## 2021-05-01 ENCOUNTER — Ambulatory Visit: Payer: Self-pay | Admitting: Nurse Practitioner

## 2021-06-26 ENCOUNTER — Ambulatory Visit (INDEPENDENT_AMBULATORY_CARE_PROVIDER_SITE_OTHER): Payer: Medicaid Other

## 2021-06-26 ENCOUNTER — Ambulatory Visit: Payer: Medicaid Other

## 2021-06-26 ENCOUNTER — Other Ambulatory Visit: Payer: Self-pay

## 2021-06-26 ENCOUNTER — Encounter: Payer: Self-pay | Admitting: Emergency Medicine

## 2021-06-26 ENCOUNTER — Ambulatory Visit
Admission: EM | Admit: 2021-06-26 | Discharge: 2021-06-26 | Disposition: A | Discharge status: Home / Self Care | Payer: Medicaid Other | Attending: Emergency Medicine | Admitting: Emergency Medicine

## 2021-06-26 DIAGNOSIS — J0100 Acute maxillary sinusitis, unspecified: Secondary | ICD-10-CM | POA: Diagnosis not present

## 2021-06-26 DIAGNOSIS — R059 Cough, unspecified: Secondary | ICD-10-CM | POA: Diagnosis not present

## 2021-06-26 DIAGNOSIS — R051 Acute cough: Secondary | ICD-10-CM | POA: Diagnosis not present

## 2021-06-26 DIAGNOSIS — R0981 Nasal congestion: Secondary | ICD-10-CM | POA: Diagnosis not present

## 2021-06-26 DIAGNOSIS — R042 Hemoptysis: Secondary | ICD-10-CM

## 2021-06-26 MED ORDER — AMOXICILLIN 875 MG PO TABS: 875.0000 mg | ORAL_TABLET | Freq: Two times a day (BID) | ORAL | 0 refills | Status: AC

## 2021-06-26 MED ORDER — BENZONATATE 100 MG PO CAPS: 100.0000 mg | ORAL_CAPSULE | Freq: Three times a day (TID) | ORAL | 0 refills | Status: AC | PRN

## 2021-06-26 NOTE — Discharge Instructions (Addendum)
Take the amoxicillin and Tessalon Perles as directed.    Follow up with your primary care provider if your symptoms are not improving.    

## 2021-06-26 NOTE — ED Triage Notes (Signed)
Pt here with cough after URI last week. Nasal congestion is still present and cough is productive with copious mucous that is tinged with blood.

## 2021-06-26 NOTE — ED Provider Notes (Signed)
Roderic Palau    CSN: [id number] Arrival date & time: 06/26/21  1931      History   Chief Complaint Chief Complaint  Patient presents with   Cough   Nasal Congestion    HPI Shirley Weber is a 35 y.o. female. Patient presents with >1 week of congestion, runny nose, postnasal drip, and cough.  She reports cough productive of blood-tinged green mucous.  No fever, shortness of breath, vomiting, diarrhea, or other symptoms.  Treatment attempted with OTC cold medication.  No pertinent medical history.  LMP: current.   The history is provided by the patient.   History reviewed. No pertinent past medical history.  There are no problems to display for this patient.   Past Surgical History:  Procedure Laterality Date   DILATION AND CURETTAGE OF UTERUS  12/2012   blighted ovum    OB History   No obstetric history on file.      Home Medications    Prior to Admission medications   Medication Sig Start Date End Date Taking? Authorizing Provider  amoxicillin (AMOXIL) 875 MG tablet Take 1 tablet (875 mg total) by mouth 2 (two) times daily for 7 days. 06/26/21 07/03/21 Yes Sharion Balloon, NP  benzonatate (TESSALON) 100 MG capsule Take 1 capsule (100 mg total) by mouth 3 (three) times daily as needed for cough. 06/26/21  Yes Sharion Balloon, NP  acetaminophen (TYLENOL) 325 MG tablet Take by mouth. 09/28/17   [provider]  COVID-19 At Home Antigen Test [facility] COVID-19 HOME TEST) KIT use as directed 02/07/21   Letta Median, RPH  Docusate Sodium (DSS) 100 MG CAPS Take by mouth. 09/28/17   [provider]  ibuprofen (ADVIL) 600 MG tablet Take by mouth. 09/28/17   [provider]  metroNIDAZOLE (FLAGYL) 500 MG tablet Take 1 tablet (500 mg total) by mouth 2 (two) times daily. 10/25/19   Sharion Balloon, NP  Norgestimate-Ethinyl Estradiol Triphasic 0.18/0.215/0.25 MG-25 MCG tab Take 1 tablet by mouth daily. 10/28/19   Streilein, Ralene Bathe, PA-C   Prenatal 28-0.8 MG TABS Take by mouth.    [provider]  Prenatal Vit-Fe Fumarate-FA (PNV PRENATAL PLUS MULTIVITAMIN) 27-1 MG TABS Take by mouth.    [provider]  Prenatal Vit-Fe Fumarate-FA (PRENATAL MULTIVITAMIN) TABS tablet Take 1 tablet by mouth daily at 12 noon.    [provider]  zolpidem (AMBIEN) 5 MG tablet Take by mouth.    [provider]    Family History Family History  Problem Relation Age of Onset   Clotting disorder Mother        blood clots   Arrhythmia Father    Heart disease Maternal Grandfather     Social History Social History   Tobacco Use   Smoking status: Never   Smokeless tobacco: Never  Substance Use Topics   Alcohol use: Yes   Drug use: No     Allergies   Latex   Review of Systems Review of Systems  Constitutional:  Negative for chills and fever.  HENT:  Positive for congestion, postnasal drip and rhinorrhea. Negative for ear pain and sore throat.   Respiratory:  Positive for cough. Negative for shortness of breath.   Cardiovascular:  Negative for chest pain and palpitations.  Gastrointestinal:  Negative for diarrhea and vomiting.  Skin:  Negative for color change and rash.  All other systems reviewed and are negative.   Physical Exam Triage Vital Signs ED Triage Vitals [06/26/21  1933]  Enc Vitals Group     BP      Pulse      Resp      Temp      Temp src      SpO2      Weight      Height      Head Circumference      Peak Flow      Pain Score 0     Pain Loc      Pain Edu?      Excl. in [place]?    No data found.  Updated Vital Signs BP 129/82 (BP Location: Left Arm)    Pulse 80    Temp 98.5 F (36.9 C) (Oral)    Resp 18    SpO2 99%   Visual Acuity Right Eye Distance:   Left Eye Distance:   Bilateral Distance:    Right Eye Near:   Left Eye Near:    Bilateral Near:     Physical Exam Vitals and nursing note reviewed.  Constitutional:      General: She is not in acute distress.     Appearance: She is well-developed.  HENT:     Right Ear: Tympanic membrane normal.     Left Ear: Tympanic membrane normal.     Nose: Congestion and rhinorrhea present.     Mouth/Throat:     Mouth: Mucous membranes are moist.     Pharynx: Oropharynx is clear.  Cardiovascular:     Rate and Rhythm: Normal rate and regular rhythm.     Heart sounds: Normal heart sounds.  Pulmonary:     Effort: Pulmonary effort is normal. No respiratory distress.     Breath sounds: Normal breath sounds.  Abdominal:     Palpations: Abdomen is soft.     Tenderness: There is no abdominal tenderness.  Musculoskeletal:     Cervical back: Neck supple.  Skin:    General: Skin is warm and dry.  Neurological:     Mental Status: She is alert.  Psychiatric:        Mood and Affect: Mood normal.        Behavior: Behavior normal.     UC Treatments / Results  Labs (all labs ordered are listed, but only abnormal results are displayed) Labs Reviewed - No data to display  EKG   Radiology DG Chest 2 View  Result Date: 06/26/2021 CLINICAL DATA:  Cough.  Nasal congestion. EXAM: CHEST - 2 VIEW COMPARISON:  None. FINDINGS: Lateral view degraded by patient arm position. Midline trachea.  Normal heart size and mediastinal contours. Sharp costophrenic angles.  No pneumothorax.  Clear lungs. IMPRESSION: No active cardiopulmonary disease. Electronically Signed   By: Abigail Miyamoto M.D.   On: 06/26/2021 19:45    Procedures Procedures (including critical care time)  Medications Ordered in UC Medications - No data to display  Initial Impression / Assessment and Plan / UC Course  I have reviewed the triage vital signs and the nursing notes.  Pertinent labs & imaging results that were available during my care of the patient were reviewed by me and considered in my medical decision making (see chart for details).    Acute sinusitis, cough, hemoptysis.  Patient states she has been coughing up green sputum that is  blood-tinged.  Lungs are clear, O2 sat 99% on room air.  CXR negative.  Treatment with amoxicillin and Tessalon Perles.  Instructed her to follow-up with her PCP if her symptoms are  not improving.  She agrees to plan of care.      Final Clinical Impressions(s) / UC Diagnoses   Final diagnoses:  Acute non-recurrent maxillary sinusitis  Acute cough  Hemoptysis     Discharge Instructions      Take the amoxicillin and Tessalon Perles as directed.  Follow up with your primary care provider if your symptoms are not improving.         ED Prescriptions     Medication Sig Dispense Auth. Provider   amoxicillin (AMOXIL) 875 MG tablet Take 1 tablet (875 mg total) by mouth 2 (two) times daily for 7 days. 14 tablet Sharion Balloon, NP   benzonatate (TESSALON) 100 MG capsule Take 1 capsule (100 mg total) by mouth 3 (three) times daily as needed for cough. 21 capsule Sharion Balloon, NP      PDMP not reviewed this encounter.   Sharion Balloon, NP 06/26/21 [phone]

## 2021-09-10 DIAGNOSIS — Y990 Civilian activity done for income or pay: Secondary | ICD-10-CM | POA: Diagnosis not present

## 2021-09-10 DIAGNOSIS — S43411A Sprain of right coracohumeral (ligament), initial encounter: Secondary | ICD-10-CM | POA: Diagnosis not present

## 2021-09-10 DIAGNOSIS — S4991XA Unspecified injury of right shoulder and upper arm, initial encounter: Secondary | ICD-10-CM | POA: Diagnosis present

## 2021-09-10 DIAGNOSIS — X58XXXA Exposure to other specified factors, initial encounter: Secondary | ICD-10-CM | POA: Diagnosis not present

## 2021-09-10 NOTE — ED Triage Notes (Signed)
Pt has right shoulder pain.  Pt taking otc meds without relief.  Pt is a Engineer, civil (consulting) at [employer].  States WC.  Pt alert  speech clear.

## 2021-09-11 ENCOUNTER — Other Ambulatory Visit: Payer: Self-pay

## 2021-09-11 ENCOUNTER — Emergency Department
Admission: EM | Admit: 2021-09-11 | Discharge: 2021-09-11 | Disposition: A | Discharge status: Home / Self Care | Payer: No Typology Code available for payment source | Attending: Emergency Medicine | Admitting: Emergency Medicine

## 2021-09-11 ENCOUNTER — Encounter: Payer: Self-pay | Admitting: *Deleted

## 2021-09-11 ENCOUNTER — Emergency Department: Payer: No Typology Code available for payment source

## 2021-09-11 DIAGNOSIS — S43411A Sprain of right coracohumeral (ligament), initial encounter: Secondary | ICD-10-CM

## 2021-09-11 DIAGNOSIS — M25511 Pain in right shoulder: Secondary | ICD-10-CM

## 2021-09-11 MED ORDER — LIDOCAINE 5 % EX PTCH
1.0000 | MEDICATED_PATCH | CUTANEOUS | Status: DC
  Administered 2021-09-11: 1 via TRANSDERMAL
  Filled 2021-09-11: qty 1

## 2021-09-11 MED ORDER — METHOCARBAMOL 500 MG PO TABS
500.0000 mg | ORAL_TABLET | Freq: Once | ORAL | Status: AC
  Administered 2021-09-11: 500 mg via ORAL
  Filled 2021-09-11: qty 1

## 2021-09-11 MED ORDER — LIDOCAINE 5 % EX PTCH: 1.0000 | MEDICATED_PATCH | Freq: Two times a day (BID) | CUTANEOUS | 0 refills | Status: AC

## 2021-09-11 MED ORDER — METHOCARBAMOL 500 MG PO TABS: 500.0000 mg | ORAL_TABLET | Freq: Four times a day (QID) | ORAL | 0 refills | Status: AC | PRN

## 2021-09-11 MED ORDER — ACETAMINOPHEN 500 MG PO TABS
1000.0000 mg | ORAL_TABLET | Freq: Once | ORAL | Status: AC
  Administered 2021-09-11: 1000 mg via ORAL
  Filled 2021-09-11: qty 2

## 2021-09-11 MED ORDER — KETOROLAC TROMETHAMINE 30 MG/ML IJ SOLN
30.0000 mg | Freq: Once | INTRAMUSCULAR | Status: AC
  Administered 2021-09-11: 30 mg via INTRAMUSCULAR
  Filled 2021-09-11: qty 1

## 2021-09-11 NOTE — ED Provider Notes (Signed)
[facility] Provider Note    Event Date/Time   First MD Initiated Contact with Patient 09/11/21 0148     (approximate)   History   Shoulder Pain   HPI  MISTEY FIMBRES is a 36 y.o. female who presents to the ED for evaluation of Shoulder Pain   No relevant history in the chart. Patient is a Marine scientist upstairs.  She presents to the ED for evaluation of right shoulder pain that has become progressively worsening throughout the day today.  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  Reports using Tylenol and NSAIDs with minimal improvement. No discrete injuries, falls or trauma.  Pain is to the anterior aspect of her right shoulder.   Denies chest pain, shortness of breath, dyspnea on exertion  Physical Exam   Triage Vital Signs: ED Triage Vitals  Enc Vitals Group     BP 09/11/21 0003 112/78     Pulse Rate 09/11/21 0003 68     Resp 09/11/21 0003 20     Temp 09/11/21 0003 98.4 F (36.9 C)     Temp Source 09/11/21 0003 Oral     SpO2 09/11/21 0003 96 %     Weight 09/11/21 0000 200 lb (90.7 kg)     Height 09/11/21 0000 5\' 5"  (1.651 m)     Head Circumference --      Peak Flow --      Pain Score 09/11/21 0000 8     Pain Loc --      Pain Edu? --      Excl. in [place]? --     Most recent vital signs: Vitals:   09/11/21 0003  BP: 112/78  Pulse: 68  Resp: 20  Temp: 98.4 F (36.9 C)  SpO2: 96%    General: Awake, no distress.  CV:  Good peripheral perfusion.  Resp:  Normal effort.  Abd:  No distention.  MSK:  No deformity noted.  No rash or overlying skin changes.  No signs of trauma. She is bracing that right arm against her body, distally neurovascularly intact with strong radial pulse and firm grip strength Most tender to the anterior joint line of the glenohumeral joint.  Really is hyperalgesic as she flinches with me just barely stroking her T-shirt over her  right shoulder joint. No tenderness to palpation posteriorly over the supraspinatus, infraspinatus, trapezius muscles. Neuro:  No focal deficits appreciated. Other:     ED Results / Procedures / Treatments   Labs (all labs ordered are listed, but only abnormal results are displayed) Labs Reviewed - No data to display  EKG   RADIOLOGY Plain films of the right shoulder reviewed by me without evidence of fracture or dislocation  Official radiology report(s): DG Shoulder Right  Result Date: 09/11/2021 CLINICAL DATA:  Right shoulder pain. EXAM: RIGHT SHOULDER - 2+ VIEW COMPARISON:  Measure radiograph dated 09/16/2008. FINDINGS: No acute fracture or dislocation. The bones are well mineralized. No arthritic change. Faint densities along the lateral humeral head, may be related to tendinopathy or superimposed over the skin. The soft tissues are unremarkable. IMPRESSION: 1. No acute fracture or dislocation. 2. Possible tendinopathy of the rotator cuff. Electronically Signed   By: Anner Crete M.D.   On: 09/11/2021 00:40    PROCEDURES and INTERVENTIONS:  Procedures  Medications  lidocaine (LIDODERM) 5 % 1 patch (1 patch Transdermal Patch Applied 09/11/21 0226)  acetaminophen (TYLENOL) tablet  1,000 mg (1,000 mg Oral Given 09/11/21 0226)  ketorolac (TORADOL) 30 MG/ML injection 30 mg (30 mg Intramuscular Given 09/11/21 0226)  methocarbamol (ROBAXIN) tablet 500 mg (500 mg Oral Given 09/11/21 0226)     IMPRESSION / MDM / ASSESSMENT AND PLAN / ED COURSE  I reviewed the triage vital signs and the nursing notes.  36 year old woman presents to the ED with progressively worsening right shoulder pain throughout the day today while at work, with evidence of soft tissue strain and possible rotator cuff injury, suitable for outpatient management with orthopedic follow-up.  She looks systemically well.  Right arm is distally neurovascularly intact.  She is exquisitely tender and hyperalgesic to anterior  aspect of her right glenohumeral joint.  No signs of rash, skin changes or open injury.  X-ray without bony injury.  Improving mobility and symptoms with nonnarcotic multimodal analgesia.  We will place the patient in a sling and have her follow-up with Ortho.  Clinical Course as of 09/11/21 0354  Tue Sep 11, 2021  0314 Reassessed.  More relaxed and improved mobility of the right shoulder.  Still quite tender to anterior aspect of the glenohumeral joint.  Continues to have a reassuring neurologic and vascular examination.  We discussed management at home, following up with Ortho and return precautions for the ED. [DS]    Clinical Course User Index [DS] Vladimir Crofts, MD     FINAL CLINICAL IMPRESSION(S) / ED DIAGNOSES   Final diagnoses:  Acute pain of right shoulder  Sprain of right coracohumeral ligament, initial encounter     Rx / DC Orders   ED Discharge Orders          Ordered    lidocaine (LIDODERM) 5 %  Every 12 hours        09/11/21 0314    methocarbamol (ROBAXIN) 500 MG tablet  Every 6 hours PRN        09/11/21 0314             Note:  This document was prepared using Dragon voice recognition software and may include unintentional dictation errors.   Vladimir Crofts, MD 09/11/21 [phone]

## 2021-09-11 NOTE — ED Notes (Signed)
Pt right shoulder hurts to the touch and is slightly swollen. Pt stated that it hurts to move and got consistently worse at work today.

## 2021-09-11 NOTE — Discharge Instructions (Signed)
Please take Tylenol and ibuprofen/Advil for your pain.  It is safe to take them together, or to alternate them every few hours.  Take up to 1000mg  of Tylenol at a time, up to 4 times per day.  Do not take more than 4000 mg of Tylenol in 24 hours.  For ibuprofen, take 400-600 mg, 4-5 times per day.  Please use lidocaine patches at your site of pain.  Apply 1 patch at a time, leave on for 12 hours, then remove for 12 hours.  12 hours on, 12 hours off.  Do not apply more than 1 patch at a time.  Use Robaxin/methocarbamol muscle relaxers as needed for more severe pain.
